# Patient Record
Sex: Male | Born: 2000 | Race: White | Hispanic: No | Marital: Single | Smoking: Never smoker
Health system: Southern US, Community
[De-identification: ages and names within clinical notes are randomized; demographics above are authoritative.]

## PROBLEM LIST (undated history)

## (undated) DIAGNOSIS — H539 Unspecified visual disturbance: Secondary | ICD-10-CM

## (undated) DIAGNOSIS — G259 Extrapyramidal and movement disorder, unspecified: Secondary | ICD-10-CM

## (undated) HISTORY — DX: Unspecified visual disturbance: H53.9

## (undated) HISTORY — DX: Extrapyramidal and movement disorder, unspecified: G25.9

---
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
## 2021-06-14 IMAGING — CT CT ENTEROGRAPHY (ABD-PELV W/ CM)
2 of 5 series · 15 of 46 positions shown, 17 images · IV contrast (iopamidol)
Comparison: None.

CLINICAL DATA: Diarrhea for approximately 4 months.

EXAM:
CT ABDOMEN AND PELVIS WITH CONTRAST (ENTEROGRAPHY)
TECHNIQUE: Multidetector CT of the abdomen and pelvis during bolus
administration of intravenous contrast. Negative oral contrast was
given.
CONTRAST:  100mL EYAQMA-DYY IOPAMIDOL (EYAQMA-DYY) INJECTION 61%

[Series 4: enterography 2.00 br40 s3 cor · coronal · 0.69mm/px · 3 of 175 slices shown]
[im 59/175  soft-tissue]
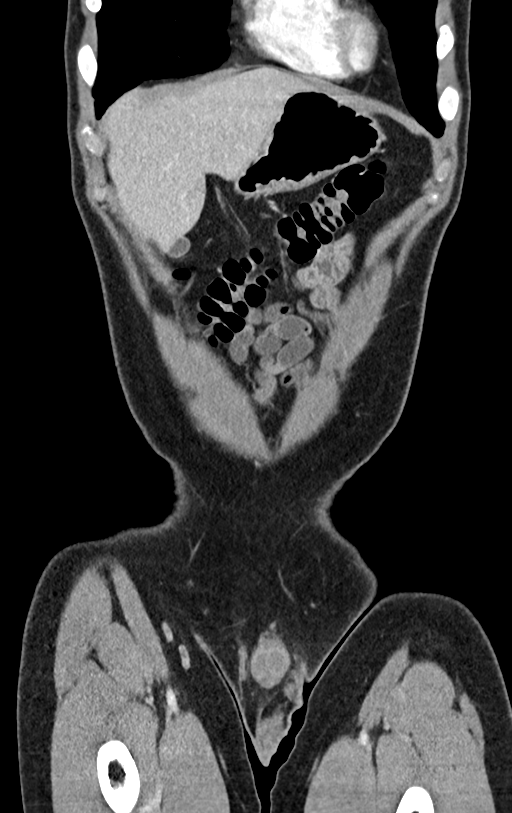
[im 78/175  soft-tissue]
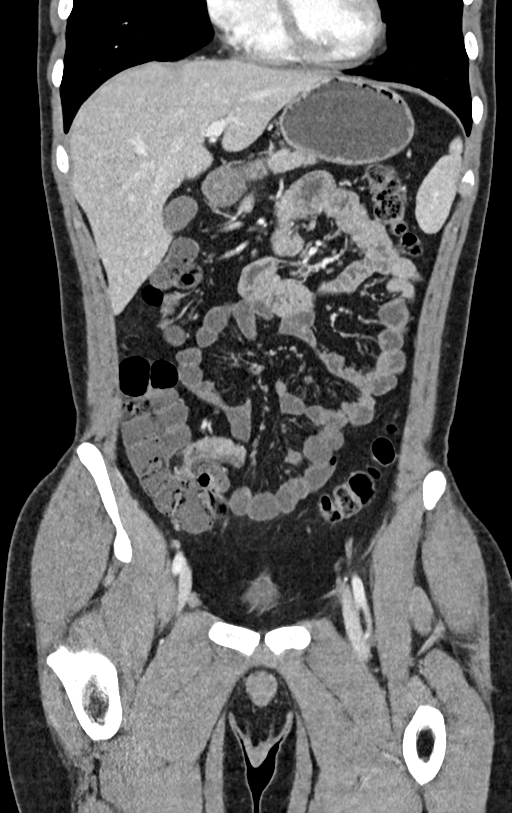
[im 97/175  soft-tissue]
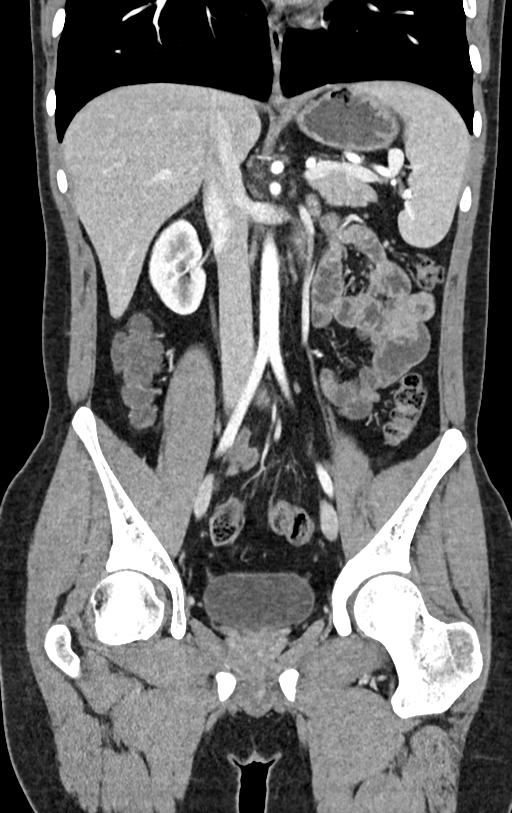

[Series 9: enterography 3.00 br40 s3 axial 3mm · axial · 0.69mm/px · z∈[+1156,+1651]mm · 12 of 187 slices shown, 14 images]
[im 11/187  soft-tissue]
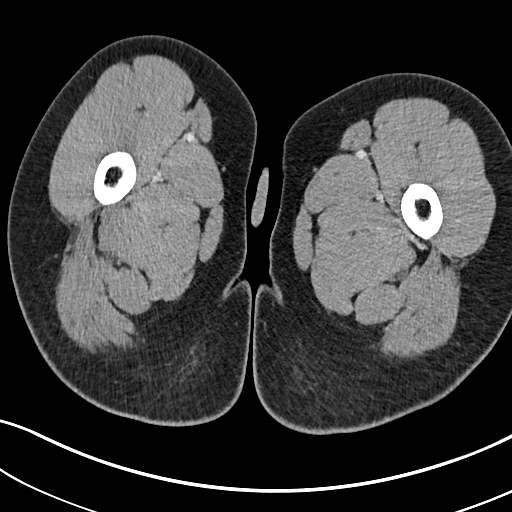
[im 11/187  bone]
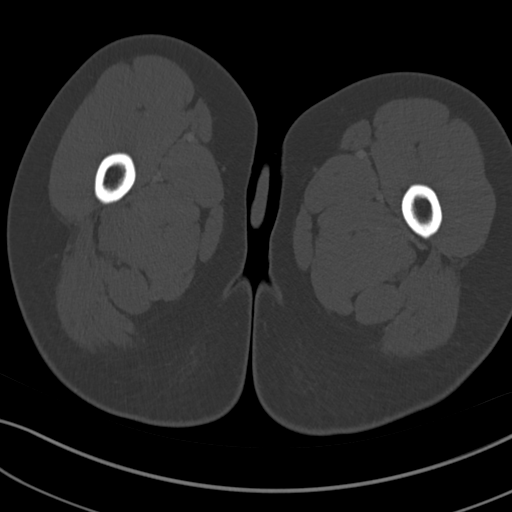
[im 33/187  soft-tissue]
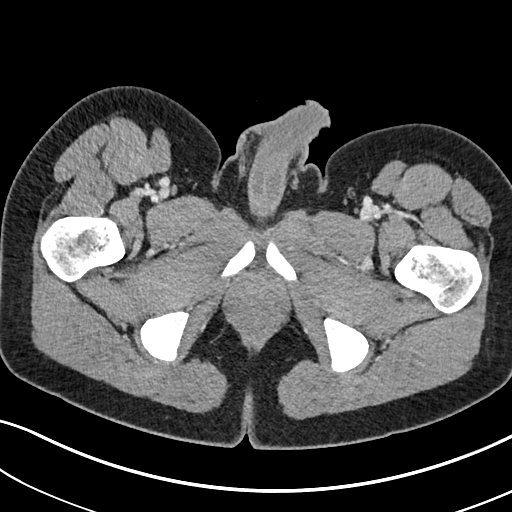
[im 44/187  soft-tissue]
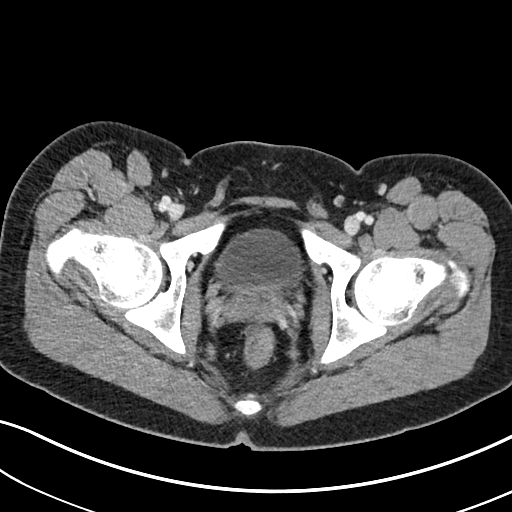
[im 55/187  soft-tissue]
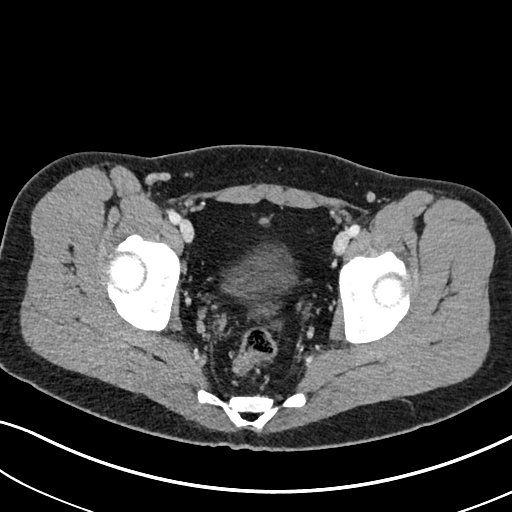
[im 77/187  soft-tissue]
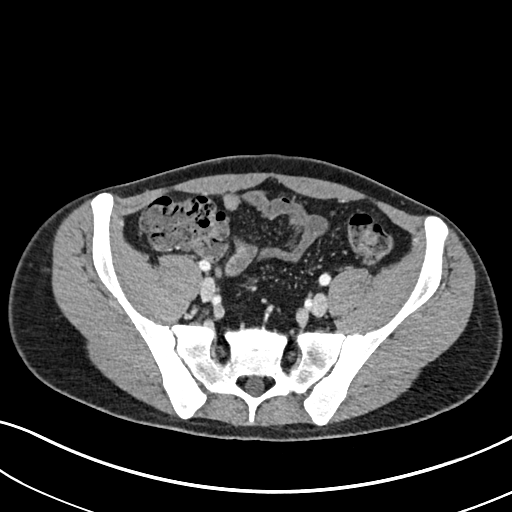
[im 88/187  soft-tissue]
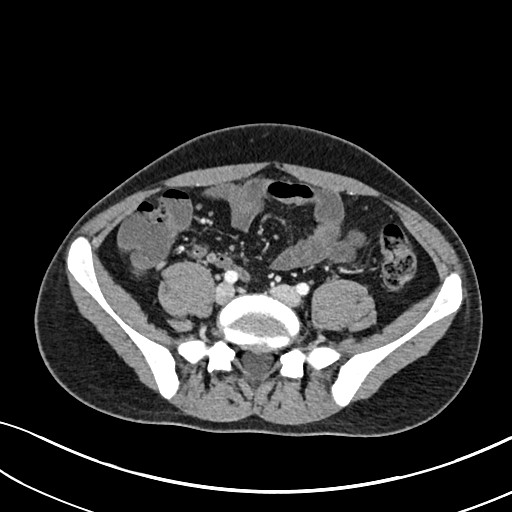
[im 99/187  soft-tissue]
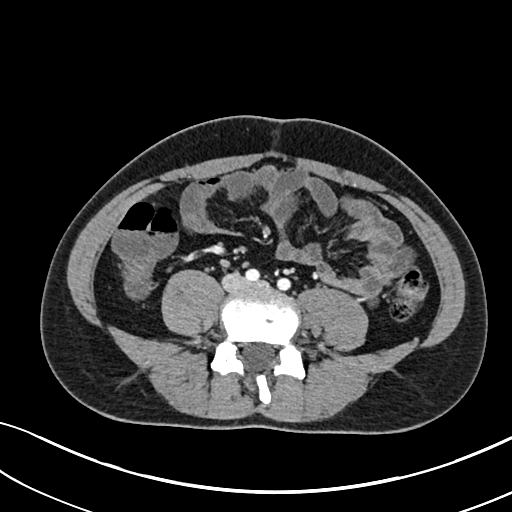
[im 121/187  soft-tissue]
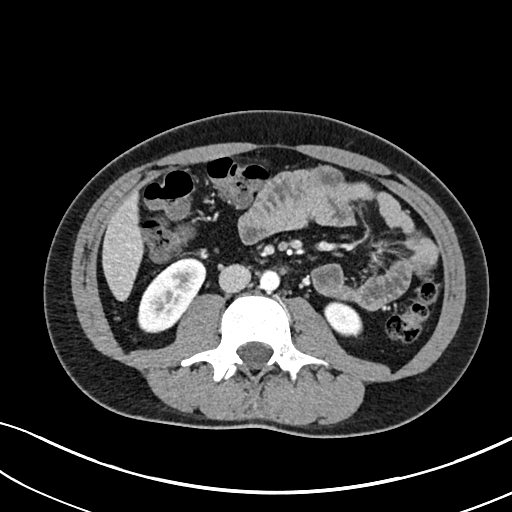
[im 132/187  soft-tissue]
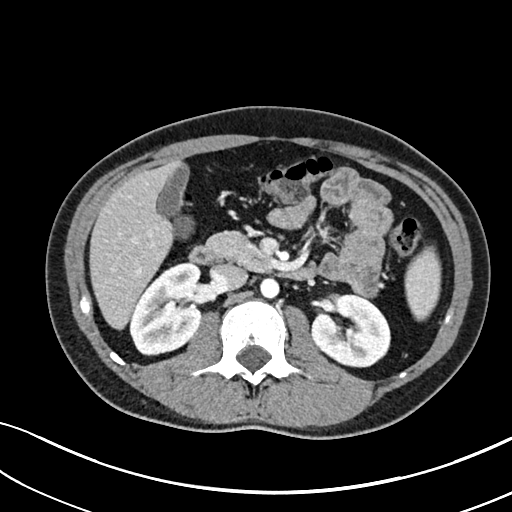
[im 132/187  bone]
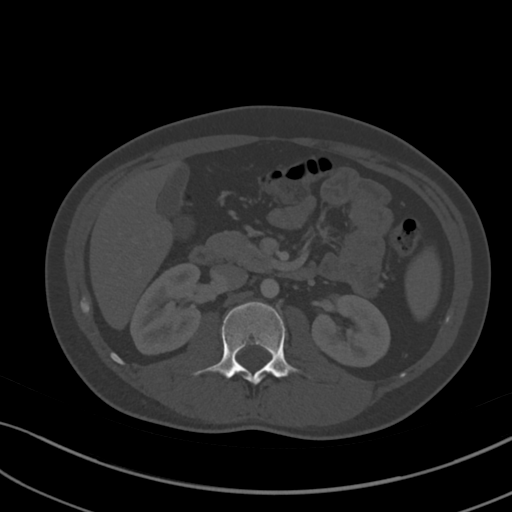
[im 143/187  soft-tissue]
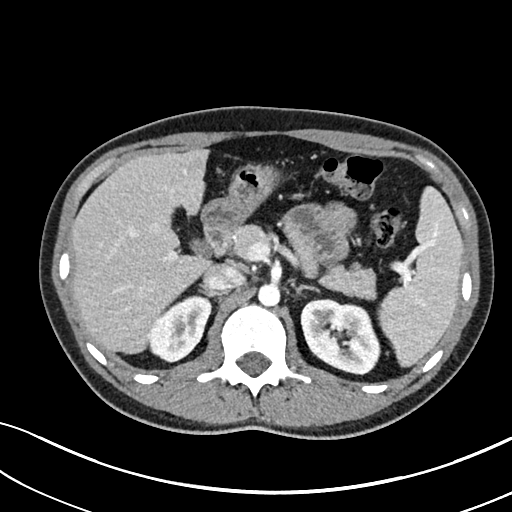
[im 165/187  soft-tissue]
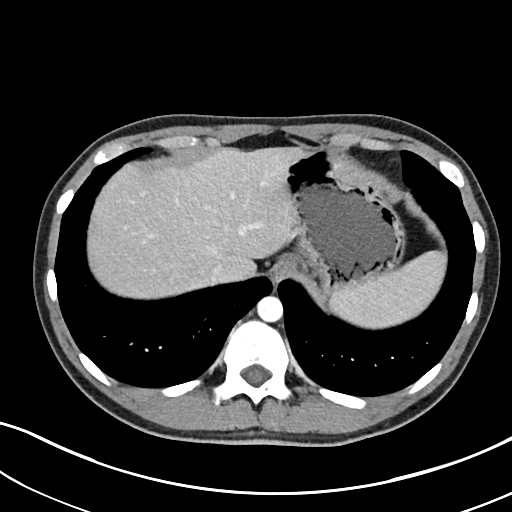
[im 176/187  soft-tissue]
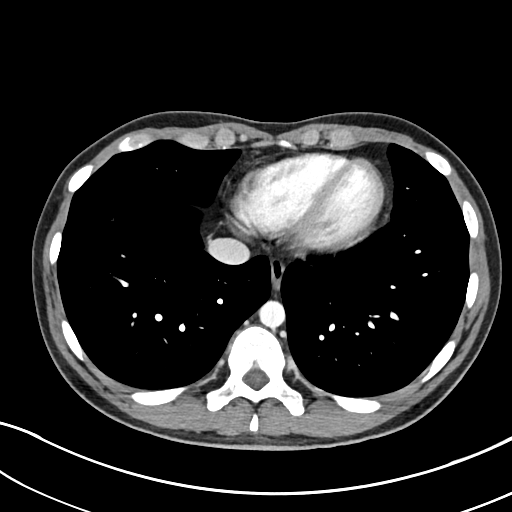

[15 of 46 positions shown; findings below may reference images not displayed]

FINDINGS: Lower Chest: No acute findings.

Hepatobiliary: No hepatic masses identified. Gallbladder is
unremarkable. No evidence of biliary ductal dilatation.

Pancreas:  No mass or inflammatory changes.

Spleen: Within normal limits in size and appearance.

Adrenals/Urinary Tract: No masses identified. No evidence of
ureteral calculi or hydronephrosis.

Stomach/Bowel: No evidence of bowel wall thickening, abnormal
contrast enhancement, or dilatation. No evidence of mesenteric
inflammatory changes, enteric fistula, or abnormal fluid
collections. The terminal ileum is normal in appearance.

Vascular/Lymphatic: No pathologically enlarged lymph nodes. No
abdominal aortic aneurysm.

Reproductive:  No mass or other significant abnormality.

Other:  None.

Musculoskeletal:  No suspicious bone lesions identified.
IMPRESSION: Negative. No radiographic evidence of inflammatory bowel disease or
other significant abnormality.
